# Patient Record
Sex: Female | Born: 1960 | Race: White | Hispanic: No | Smoking: Never smoker
Health system: Southern US, Community
[De-identification: ages and names within clinical notes are randomized; demographics above are authoritative.]

## PROBLEM LIST (undated history)

## (undated) DIAGNOSIS — J45909 Unspecified asthma, uncomplicated: Secondary | ICD-10-CM

## (undated) DIAGNOSIS — F329 Major depressive disorder, single episode, unspecified: Secondary | ICD-10-CM

## (undated) DIAGNOSIS — F32A Depression, unspecified: Secondary | ICD-10-CM

## (undated) HISTORY — PX: TONSILLECTOMY: SUR1361

## (undated) HISTORY — PX: WISDOM TOOTH EXTRACTION: SHX21

## (undated) HISTORY — PX: FINGER SURGERY: SHX640

---

## 2010-01-05 ENCOUNTER — Encounter: Payer: Self-pay | Admitting: Sports Medicine

## 2010-01-05 ENCOUNTER — Ambulatory Visit: Payer: Self-pay | Admitting: Sports Medicine

## 2010-01-05 DIAGNOSIS — M2170 Unequal limb length (acquired), unspecified site: Secondary | ICD-10-CM

## 2010-01-05 DIAGNOSIS — M79609 Pain in unspecified limb: Secondary | ICD-10-CM

## 2010-01-05 DIAGNOSIS — M84376A Stress fracture, unspecified foot, initial encounter for fracture: Secondary | ICD-10-CM | POA: Insufficient documentation

## 2010-02-07 ENCOUNTER — Emergency Department (HOSPITAL_COMMUNITY)
Admission: EM | Admit: 2010-02-07 | Discharge: 2010-02-08 | Payer: Self-pay | Source: Home / Self Care | Admitting: Emergency Medicine

## 2010-02-12 ENCOUNTER — Ambulatory Visit: Payer: Self-pay | Admitting: Sports Medicine

## 2010-03-31 NOTE — Assessment & Plan Note (Signed)
Summary: foot pain   Vital Signs:  Patient profile:   49 year old female Height:      70 inches Weight:      125 pounds BMI:     18.00 Pulse rate:   61 / minute BP sitting:   100 / 69  (left arm)  Vitals Entered By: Rochele Pages RN (January 05, 2010 11:48 AM) CC: rt foot pain, dorsal @ 2nd and 3rd metatarsals, wants orthotics    CC:  rt foot pain, dorsal @ 2nd and 3rd metatarsals, and wants orthotics .  History of Present Illness: Patient presents to clinic for evaluation of rt dorsal foot pain at 2nd and 3rd metatarsals due to stress fracture.  Patient experienced stress fracture in this area while running in minimalist shoes 08/2009.  She saw a doctor who put her in a boot for 3 months.  No running since she experienced the stress fracture. No pain at rest, but with prolonged walking pain worsens and causes limping on right foot.  Patient intrested in getting orthotics today.  She reports switching to the minimilist shoe because of problems with shin splints while training for a 1/2 marathon.  She was training up to 50 miles per week.  She is still experiencing some discomfort over her 2nd and 3rd metarsals with walking but has not returned running.  No other history of running related injuries and has been running recreational for  ~67yrs and has become more serious over the last 3 years.   Allergies (verified): 1)  ! Asa  Physical Exam  General:  Well-developed,well-nourished,in no acute distress; alert,appropriate and cooperative throughout examination Msk:  Foot Exam: Right Foot slight TTP over distal aspect of 2nd & 3rd metarsal.  No swelling. Negative Drawer Sign B.  Normal ROM B.   Left foot has signs of __TMT____ bossing with associated moderate collapse of the longitudinal arch and transverse arches.  Right foot has minimal to mild arch collapse.   R Short leg with 1.2cm difference compensated with mild levoscoliosis.    LE Strength 5+/5 diffusly with only slight asymetry  of hip aBductors with L slightly weaker than R  US showed 90% healed stress fx over 2nd metarsal.  No signs of stress fx on the 3rd metarsal at this time.   Gait Analysis:  Neutral running form with mild right foot in-toeing that is corrected with a sports orthotic with minimal additional heel pad to correct R short leg Extremities:  No clubbing, cyanosis, edema, echymosis, or deformity noted.   Impression & Recommendations:  Problem # 1:  FOOT PAIN, RIGHT (ICD-729.5)  This is at a mild level now no real swelling noted  Orders: [place] LIMITED ([id number])  Problem # 2:  STRESS FRACTURE OF THE METATARSALS (ICD-733.94)  scan does show clear cortical changes suggestinve of MT stress FX 2nd MT on RT this has hard callus and 90% closed on trans scan  sports insole with blue foam padding 1/2 cm added to RT  arch strap  begin easy runing program  reck 1 month  Orders: [place] LIMITED ([id number])  Problem # 3:  UNEQUAL LEG LENGTH (ICD-736.81) requires minor adjustment  may be able to get by with just sports insole if not enough support will do custom orthotics on RTC  Complete Medication List: 1)  Nuvaring 0.12-0.015 Mg/24hr Ring (Etonogestrel-ethinyl estradiol)  Patient Instructions: 1)  Thanks for coming intoday. 2)  Your stress fracture is 90% healed.  It is okay to start back  to running but slowly.  Limit yourself to 20 mins for 2 weeks then for the next 2 weeks and so forth from there.   Limit your activity if pain returns.   3)  You need to supplement Vit D and Calcium. 4)  Calcium 1500mg  daily 5)  Vitamin D 800 International Units (IU) daily 6)  Follow up in 4 weeks   Orders Added: 1)  New Patient Level III [99203] 2)  [place] LIMITED [[id number]]

## 2010-06-22 ENCOUNTER — Encounter: Payer: Self-pay | Admitting: *Deleted

## 2013-10-17 ENCOUNTER — Emergency Department (HOSPITAL_COMMUNITY)
Admission: EM | Admit: 2013-10-17 | Discharge: 2013-10-17 | Disposition: A | Discharge status: Home / Self Care | Payer: BC Managed Care – PPO | Attending: Emergency Medicine | Admitting: Emergency Medicine

## 2013-10-17 ENCOUNTER — Emergency Department (HOSPITAL_COMMUNITY): Payer: BC Managed Care – PPO

## 2013-10-17 ENCOUNTER — Encounter (HOSPITAL_COMMUNITY): Payer: Self-pay | Admitting: Emergency Medicine

## 2013-10-17 DIAGNOSIS — S61209A Unspecified open wound of unspecified finger without damage to nail, initial encounter: Secondary | ICD-10-CM | POA: Diagnosis present

## 2013-10-17 DIAGNOSIS — S68129A Partial traumatic metacarpophalangeal amputation of unspecified finger, initial encounter: Secondary | ICD-10-CM

## 2013-10-17 DIAGNOSIS — F411 Generalized anxiety disorder: Secondary | ICD-10-CM | POA: Insufficient documentation

## 2013-10-17 DIAGNOSIS — Y9389 Activity, other specified: Secondary | ICD-10-CM | POA: Insufficient documentation

## 2013-10-17 DIAGNOSIS — Z23 Encounter for immunization: Secondary | ICD-10-CM | POA: Diagnosis not present

## 2013-10-17 DIAGNOSIS — W292XXA Contact with other powered household machinery, initial encounter: Secondary | ICD-10-CM | POA: Diagnosis not present

## 2013-10-17 DIAGNOSIS — Y9289 Other specified places as the place of occurrence of the external cause: Secondary | ICD-10-CM | POA: Diagnosis not present

## 2013-10-17 DIAGNOSIS — S68119A Complete traumatic metacarpophalangeal amputation of unspecified finger, initial encounter: Secondary | ICD-10-CM

## 2013-10-17 DIAGNOSIS — IMO0002 Reserved for concepts with insufficient information to code with codable children: Secondary | ICD-10-CM | POA: Insufficient documentation

## 2013-10-17 MED ORDER — MORPHINE SULFATE 4 MG/ML IJ SOLN: 4.0000 mg | Freq: Once | INTRAMUSCULAR | Status: DC

## 2013-10-17 MED ORDER — TETANUS-DIPHTH-ACELL PERTUSSIS 5-2.5-18.5 LF-MCG/0.5 IM SUSP
0.5000 mL | Freq: Once | INTRAMUSCULAR | Status: AC
Start: 2013-10-17 — End: 2013-10-17
  Administered 2013-10-17: 0.5 mL via INTRAMUSCULAR
  Filled 2013-10-17: qty 0.5

## 2013-10-17 MED ORDER — CEFAZOLIN SODIUM 1-5 GM-% IV SOLN
1.0000 g | Freq: Once | INTRAVENOUS | Status: AC
  Administered 2013-10-17: 1 g via INTRAVENOUS
  Filled 2013-10-17: qty 50

## 2013-10-17 NOTE — Discharge Instructions (Signed)
[facility]: [address] [place] [address] [phone] DO NOT EAT OR DRINK ANYTHING.
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
## 2013-10-20 NOTE — ED Provider Notes (Signed)
Medical screening examination/treatment/procedure(s) were performed by non-physician practitioner and as supervising physician I was immediately available for consultation/collaboration.   EKG Interpretation None        Eloise Picone, DO 10/20/13 1451 

## 2017-10-16 ENCOUNTER — Emergency Department (HOSPITAL_COMMUNITY)
Admission: EM | Admit: 2017-10-16 | Discharge: 2017-10-16 | Disposition: A | Discharge status: Home / Self Care | Payer: BC Managed Care – PPO | Attending: Emergency Medicine | Admitting: Emergency Medicine

## 2017-10-16 ENCOUNTER — Emergency Department (HOSPITAL_COMMUNITY): Payer: BC Managed Care – PPO

## 2017-10-16 ENCOUNTER — Encounter (HOSPITAL_COMMUNITY): Payer: Self-pay | Admitting: *Deleted

## 2017-10-16 ENCOUNTER — Other Ambulatory Visit: Payer: Self-pay

## 2017-10-16 DIAGNOSIS — Y998 Other external cause status: Secondary | ICD-10-CM | POA: Diagnosis not present

## 2017-10-16 DIAGNOSIS — R911 Solitary pulmonary nodule: Secondary | ICD-10-CM

## 2017-10-16 DIAGNOSIS — S50311A Abrasion of right elbow, initial encounter: Secondary | ICD-10-CM | POA: Diagnosis not present

## 2017-10-16 DIAGNOSIS — S50312A Abrasion of left elbow, initial encounter: Secondary | ICD-10-CM | POA: Diagnosis not present

## 2017-10-16 DIAGNOSIS — S40212A Abrasion of left shoulder, initial encounter: Secondary | ICD-10-CM | POA: Insufficient documentation

## 2017-10-16 DIAGNOSIS — Y9355 Activity, bike riding: Secondary | ICD-10-CM | POA: Insufficient documentation

## 2017-10-16 DIAGNOSIS — Y929 Unspecified place or not applicable: Secondary | ICD-10-CM | POA: Diagnosis not present

## 2017-10-16 DIAGNOSIS — S0990XA Unspecified injury of head, initial encounter: Secondary | ICD-10-CM | POA: Diagnosis present

## 2017-10-16 DIAGNOSIS — Z79899 Other long term (current) drug therapy: Secondary | ICD-10-CM | POA: Insufficient documentation

## 2017-10-16 DIAGNOSIS — S060X9A Concussion with loss of consciousness of unspecified duration, initial encounter: Secondary | ICD-10-CM | POA: Insufficient documentation

## 2017-10-16 DIAGNOSIS — J45909 Unspecified asthma, uncomplicated: Secondary | ICD-10-CM | POA: Insufficient documentation

## 2017-10-16 DIAGNOSIS — S40211A Abrasion of right shoulder, initial encounter: Secondary | ICD-10-CM | POA: Diagnosis not present

## 2017-10-16 DIAGNOSIS — T07XXXA Unspecified multiple injuries, initial encounter: Secondary | ICD-10-CM

## 2017-10-16 HISTORY — DX: Depression, unspecified: F32.A

## 2017-10-16 HISTORY — DX: Major depressive disorder, single episode, unspecified: F32.9

## 2017-10-16 HISTORY — DX: Unspecified asthma, uncomplicated: J45.909

## 2017-10-16 NOTE — Discharge Instructions (Addendum)
Please stay with another person for the next 24 hours. Recheck with your doctor this week No driving or operating machinery until seen for recheck Shower wounds regularly Use tylenol and ibuprofen for pain
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
## 2018-12-21 ENCOUNTER — Other Ambulatory Visit: Payer: Self-pay

## 2018-12-21 DIAGNOSIS — Z20822 Contact with and (suspected) exposure to covid-19: Secondary | ICD-10-CM

## 2018-12-23 LAB — NOVEL CORONAVIRUS, NAA: SARS-CoV-2, NAA: DETECTED — AB
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
## 2021-07-06 ENCOUNTER — Other Ambulatory Visit (HOSPITAL_COMMUNITY): Payer: Self-pay | Admitting: Family Medicine

## 2021-07-06 DIAGNOSIS — E785 Hyperlipidemia, unspecified: Secondary | ICD-10-CM

## 2021-08-18 ENCOUNTER — Ambulatory Visit (HOSPITAL_COMMUNITY): Payer: Self-pay

## 2021-08-20 ENCOUNTER — Ambulatory Visit (HOSPITAL_COMMUNITY)
Admission: RE | Admit: 2021-08-20 | Discharge: 2021-08-20 | Disposition: A | Discharge status: Home / Self Care | Payer: BC Managed Care – PPO | Source: Ambulatory Visit | Attending: Family Medicine | Admitting: Family Medicine

## 2021-08-20 DIAGNOSIS — E785 Hyperlipidemia, unspecified: Secondary | ICD-10-CM | POA: Insufficient documentation

## 2021-09-28 ENCOUNTER — Other Ambulatory Visit: Payer: Self-pay | Admitting: Pulmonary Disease

## 2021-09-28 DIAGNOSIS — Z1389 Encounter for screening for other disorder: Secondary | ICD-10-CM

## 2022-01-19 ENCOUNTER — Other Ambulatory Visit: Payer: Self-pay | Admitting: Family Medicine

## 2022-01-19 DIAGNOSIS — R10819 Abdominal tenderness, unspecified site: Secondary | ICD-10-CM

## 2022-01-28 ENCOUNTER — Other Ambulatory Visit: Payer: BC Managed Care – PPO

## 2022-02-02 ENCOUNTER — Ambulatory Visit
Admission: RE | Admit: 2022-02-02 | Discharge: 2022-02-02 | Disposition: A | Discharge status: Home / Self Care | Payer: BC Managed Care – PPO | Source: Ambulatory Visit | Attending: Family Medicine | Admitting: Family Medicine

## 2022-02-02 ENCOUNTER — Other Ambulatory Visit: Payer: BC Managed Care – PPO

## 2022-02-02 DIAGNOSIS — R10819 Abdominal tenderness, unspecified site: Secondary | ICD-10-CM

## 2022-02-26 ENCOUNTER — Ambulatory Visit
Admission: RE | Admit: 2022-02-26 | Discharge: 2022-02-26 | Disposition: A | Discharge status: Home / Self Care | Payer: No Typology Code available for payment source | Source: Ambulatory Visit | Attending: Pulmonary Disease | Admitting: Pulmonary Disease

## 2022-02-26 DIAGNOSIS — Z1389 Encounter for screening for other disorder: Secondary | ICD-10-CM

## 2022-03-23 ENCOUNTER — Other Ambulatory Visit: Payer: BC Managed Care – PPO

## 2022-04-15 ENCOUNTER — Ambulatory Visit: Payer: BC Managed Care – PPO | Attending: Cardiovascular Disease | Admitting: Cardiovascular Disease

## 2022-04-15 ENCOUNTER — Encounter: Payer: Self-pay | Admitting: Cardiovascular Disease

## 2022-04-15 VITALS — BP 118/70 | HR 63 | Ht 69.0 in | Wt 139.4 lb

## 2022-04-15 DIAGNOSIS — I712 Thoracic aortic aneurysm, without rupture, unspecified: Secondary | ICD-10-CM | POA: Diagnosis not present

## 2022-04-15 DIAGNOSIS — R42 Dizziness and giddiness: Secondary | ICD-10-CM

## 2022-04-15 NOTE — Patient Instructions (Signed)
Medication Instructions:  No changes *If you need a refill on your cardiac medications before your next appointment, please call your pharmacy*   Lab Work: none   Testing/Procedures: Your physician has requested that you have an echocardiogram. Echocardiography is a painless test that uses sound waves to create images of your heart. It provides your doctor with information about the size and shape of your heart and how well your heart's chambers and valves are working. This procedure takes approximately one hour. There are no restrictions for this procedure. Please do NOT wear cologne, perfume, aftershave, or lotions (deodorant is allowed). Please arrive 15 minutes prior to your appointment time.   Follow-Up: At [facility] you and your health needs are our priority.  As part of our continuing mission to provide you with exceptional heart care, we have created designated Provider Care Teams.  These Care Teams include your primary Cardiologist (physician) and Advanced Practice Providers (APPs -  Physician Assistants and Nurse Practitioners) who all work together to provide you with the care you need, when you need it.   Your next appointment:   12 month(s)  Provider:   Lauree Chandler, MD     Other Instructions Will arrange next CT scan aorta at next year's visit.

## 2022-04-15 NOTE — Progress Notes (Signed)
Chief Complaint  Patient presents with   New Patient (Initial Visit)    Abnormal CT, cardiomegaly   History of Present Illness:62 yo female with history of asthma and mild dilation of the ascending aorta who is here today as a new consult for the evaluation of dizziness and abnormal CT. CT coronary calcium score of 0 in June 2023. Ascending aorta noted to be mildly dilated by the non-contrast CT in June 2023. Chest CTA in January 2024 with 4.0 cm ascending aorta, suggestion of enlarged heart. She tells me today that she has had some dizziness. This is feeling of being lightheaded. No near syncope or syncope. No chest pain or dyspnea. She is very active.   Primary Care Physician: London Pepper, MD   Past Medical History:  Diagnosis Date   Asthma    Depression     Past Surgical History:  Procedure Laterality Date   FINGER SURGERY     TONSILLECTOMY     WISDOM TOOTH EXTRACTION      Current Outpatient Medications  Medication Sig Dispense Refill   calcium-vitamin D (OSCAL WITH D) 500-200 MG-UNIT per tablet Take 1 tablet by mouth daily with breakfast.     Digestive Enzyme CAPS daily.     sertraline (ZOLOFT) 50 MG tablet Take 50 mg by mouth daily. Per patient taking 1/2 tablet (8m) daily     valACYclovir (VALTREX) 500 MG tablet Take 500 mg by mouth 2 (two) times daily as needed (for fever blisters).      No current facility-administered medications for this visit.    Allergies  Allergen Reactions   Aspirin    Codeine     Pt doesn't remember reaction.   Shellfish Allergy Hives    Social History   Socioeconomic History   Marital status: Divorced    Spouse name: Not on file   Number of children: 2   Years of education: Not on file   Highest education level: Not on file  Occupational History   Occupation: Works with autism  Tobacco Use   Smoking status: Never   Smokeless tobacco: Never  Vaping Use   Vaping Use: Never used  Substance and Sexual Activity   Alcohol  use: Yes    Comment: occasionally   Drug use: No   Sexual activity: Not on file  Other Topics Concern   Not on file  Social History Narrative   Not on file   Social Determinants of Health   Financial Resource Strain: Not on file  Food Insecurity: Not on file  Transportation Needs: Not on file  Physical Activity: Not on file  Stress: Not on file  Social Connections: Not on file  Intimate Partner Violence: Not on file    Family History  Problem Relation Age of Onset   Pulmonary fibrosis Father     Review of Systems:  As stated in the HPI and otherwise negative.   BP 118/70   Pulse 63   Ht 5' 9"$  (1.753 m)   Wt 63.2 kg   LMP 09/12/2013   SpO2 99%   BMI 20.59 kg/m   Physical Examination: General: Well developed, well nourished, NAD  HEENT: OP clear, mucus membranes moist  SKIN: warm, dry. No rashes. Neuro: No focal deficits  Musculoskeletal: Muscle strength 5/5 all ext  Psychiatric: Mood and affect normal  Neck: No JVD, no carotid bruits, no thyromegaly, no lymphadenopathy.  Lungs:Clear bilaterally, no wheezes, rhonci, crackles Cardiovascular: Regular rate and rhythm. No murmurs, gallops or rubs.  Abdomen:Soft. Bowel sounds present. Non-tender.  Extremities: No lower extremity edema. Pulses are 2 + in the bilateral DP/PT.  EKG:  EKG is ordered today. The ekg ordered today demonstrates NSR, Non-specific ST abnormality  Recent Labs: No results found for requested labs within last 365 days.   Lipid Panel No results found for: "CHOL", "TRIG", "HDL", "CHOLHDL", "VLDL", "LDLCALC", "LDLDIRECT"   Wt Readings from Last 3 Encounters:  04/15/22 63.2 kg  10/16/17 59 kg  01/05/10 56.7 kg    Assessment and Plan:   1. Dizziness: Her dizziness may be related to inner ear issues. Occurs at random times. No near syncope. BP soft at times at home but ok today. Possible cardiomegaly on chest CT. Will arrange an echo to assess LVEF and exclude structural heart disease.   2.  Thoracic aortic aneurysm: 4.0 cm by CT chest in January 2024. Repeat one year  Labs/ tests ordered today include:   Orders Placed This Encounter  Procedures   EKG 12-Lead   ECHOCARDIOGRAM COMPLETE   Disposition:   F/U with me in one year   Signed, Lauree Chandler, MD, [facility] 04/15/2022 9:24 AM    [facility] [address], [place], [place]  [address] Phone: [phone]; Fax: [phone]

## 2022-05-11 ENCOUNTER — Ambulatory Visit (HOSPITAL_COMMUNITY): Payer: BC Managed Care – PPO | Attending: Cardiovascular Disease

## 2022-05-11 DIAGNOSIS — R42 Dizziness and giddiness: Secondary | ICD-10-CM | POA: Diagnosis not present

## 2022-05-11 LAB — ECHOCARDIOGRAM COMPLETE
Area-P 1/2: 3.85 cm2
S' Lateral: 3.3 cm

## 2023-02-24 ENCOUNTER — Other Ambulatory Visit: Payer: Self-pay | Admitting: Family Medicine

## 2023-02-24 DIAGNOSIS — I77810 Thoracic aortic ectasia: Secondary | ICD-10-CM

## 2023-03-01 ENCOUNTER — Inpatient Hospital Stay
Admission: RE | Admit: 2023-03-01 | Discharge: 2023-03-01 | Discharge status: Home / Self Care | Payer: BC Managed Care – PPO | Source: Ambulatory Visit | Attending: Family Medicine | Admitting: Family Medicine

## 2023-03-01 DIAGNOSIS — I77810 Thoracic aortic ectasia: Secondary | ICD-10-CM

## 2023-03-01 MED ORDER — IOPAMIDOL (ISOVUE-370) INJECTION 76%
75.0000 mL | Freq: Once | INTRAVENOUS | Status: AC | PRN
  Administered 2023-03-01: 75 mL via INTRAVENOUS

## 2023-03-29 NOTE — Progress Notes (Signed)
 Cardiology Office Note:  .   Date:  04/11/2023  ID:  Dodson Freestone, DOB 12/23/1960, MRN [id number] PCP: Ronna Coho, MD  [facility] Providers Cardiologist:  Antoinette Batman, MD    History of Present Illness: .   Jamie Nash is a 63 y.o. female  with history of asthma and mild dilation of the ascending aorta  She saw Dr. Abel Hoe 04/2022 for dizziness and abnormal CT. CT coronary calcium score of 0 in June 2023. Ascending aorta noted to be mildly dilated by the non-contrast CT in June 2023. Chest CTA in January 2024 with 4.0 cm ascending aorta, suggestion of enlarged heart. Echo 04/2022 normal LVEF G2DD.  CTA 01/2023 4.0 ascending aorta.  Patient comes in for yearly f/u. Denies any chest pain, dyspnea, palpitations, dizziness, edema. Walks 20 miles weekly. Questions answered about activity restriction with dilated aorta.  ROS:    Studies Reviewed: Aaron Aas    EKG Interpretation Date/Time:  Monday April 11 2023 09:05:21 EST Ventricular Rate:  58 PR Interval:  130 QRS Duration:  90 QT Interval:  424 QTC Calculation: 416 R Axis:   -12  Text Interpretation: Sinus bradycardia Nonspecific ST abnormality No previous ECGs available Confirmed by Theotis Flake [phone]) on 04/11/2023 9:12:42 AM    Prior CV Studies:   Echo 04/2022   IMPRESSION: Relatively unchanged size and configuration of the ascending aorta, estimated 4.0 cm on the current CT. Recommend annual imaging followup by CTA or MRA. This recommendation follows 2010 ACCF/AHA/AATS/ACR/ASA/SCA/SCAI/SIR/STS/SVM Guidelines for the Diagnosis and Management of Patients with Thoracic Aortic Disease. Circulation. 2010; 121: M578-I696. Aortic aneurysm NOS (ICD10-I71.9)   Signed,   Marciano Settles. Rexine Cater, RPVI   [facility]   [facility]     Electronically Signed   By: Myrlene Asper D.O.   On: 03/03/2023 13:26   CT angio chest 01/2023   IMPRESSION: Relatively  unchanged size and configuration of the ascending aorta, estimated 4.0 cm on the current CT. Recommend annual imaging followup by CTA or MRA. This recommendation follows 2010 ACCF/AHA/AATS/ACR/ASA/SCA/SCAI/SIR/STS/SVM Guidelines for the Diagnosis and Management of Patients with Thoracic Aortic Disease. Circulation. 2010; 121: E952-W413. Aortic aneurysm NOS (ICD10-I71.9)   Signed,   Marciano Settles. Rexine Cater, RPVI   [facility]   [facility]     Electronically Signed   By: Myrlene Asper D.O.   On: 03/03/2023 13:26  Risk Assessment/Calculations:             Physical Exam:   VS:  BP 110/68 (BP Location: Right Arm, Patient Position: Sitting, Cuff Size: Normal)   Pulse 65   Resp 16   Ht 5\' 9"  (1.753 m)   Wt 140 lb 3.2 oz (63.6 kg)   LMP 09/12/2013   SpO2 98%   BMI 20.70 kg/m    Wt Readings from Last 3 Encounters:  04/11/23 140 lb 3.2 oz (63.6 kg)  04/15/22 139 lb 6.4 oz (63.2 kg)  10/16/17 130 lb (59 kg)    GEN: Thin, in no acute distress NECK: No JVD; No carotid bruits CARDIAC:  RRR, no murmurs, rubs, gallops RESPIRATORY:  Clear to auscultation without rales, wheezing or rhonchi  ABDOMEN: Soft, non-tender, non-distended EXTREMITIES:  No edema; No deformity   ASSESSMENT AND PLAN: .    Dilated Ascending aorta 4.0  01/2023 remains stable and unchanged   One of your tests has shown an aneurysm of your . The word "aneurysm" refers to a bulge in an  artery (blood vessel). Most people think of them in the context of an emergency, but yours was found incidentally. At this point there is nothing you need to do from a procedure standpoint, but there are some important things to keep in mind for day-to-day life.  Mainstays of therapy for aneurysms include very good blood pressure control, healthy lifestyle, and avoiding tobacco products and street drugs. Research has raised concern that antibiotics in the fluoroquinolone class could  be associated with increased risk of having an aneurysm develop or tear. This includes medicines that end in "floxacin," like Cipro or Levaquin. Make sure to discuss this information with other healthcare providers if you require antibiotics.  Since aneurysms can run in families, you should discuss your diagnosis with first degree relatives as they may need to be screened for this. Regular mild-moderate physical exercise is important, but avoid heavy lifting/weight lifting over 30lbs, chopping wood, shoveling snow or digging heavy earth with a shovel. It is best to avoid activities that cause grunting or straining (medically referred to as a "Valsalva maneuver"). This happens when a person bears down against a closed throat to increase the strength of arm or abdominal muscles. There's often a tendency to do this when lifting heavy weights, doing sit-ups, push-ups or chin-ups, etc., but it may be harmful.  This is a finding I would expect to be monitored periodically by your cardiology team. Most unruptured thoracic aortic aneurysms cause no symptoms, so they are often found during exams for other conditions. Contact a health care provider if you develop any discomfort in your upper back, neck, abdomen, trouble swallowing, cough or hoarseness, or unexplained weight loss. Get help right away if you develop severe pain in your upper back or abdomen that may move into your chest and arms, or any other concerning symptoms such as shortness of breath or fever.    Echo 04/3022 normal LVEF grade 2 DD-no edema  Dizziness felt due to inner ear issues.  Calcium score 0        Dispo: f/u in 1 yr with CTA  Signed, Theotis Flake, PA-C

## 2023-04-11 ENCOUNTER — Ambulatory Visit: Payer: 59 | Attending: Physician Assistant | Admitting: Physician Assistant

## 2023-04-11 ENCOUNTER — Encounter: Payer: Self-pay | Admitting: Physician Assistant

## 2023-04-11 VITALS — BP 110/68 | HR 65 | Resp 16 | Ht 69.0 in | Wt 140.2 lb

## 2023-04-11 DIAGNOSIS — I5189 Other ill-defined heart diseases: Secondary | ICD-10-CM

## 2023-04-11 DIAGNOSIS — I77819 Aortic ectasia, unspecified site: Secondary | ICD-10-CM

## 2023-04-11 NOTE — Patient Instructions (Addendum)
 Medication Instructions:  Your physician recommends that you continue on your current medications as directed. Please refer to the Current Medication list given to you today.  *If you need a refill on your cardiac medications before your next appointment, please call your pharmacy*   Lab Work: None ordered  If you have labs (blood work) drawn today and your tests are completely normal, you will receive your results only by: MyChart Message (if you have MyChart) OR A paper copy in the mail If you have any lab test that is abnormal or we need to change your treatment, we will call you to review the results.   Testing/Procedures: None ordered   Follow-Up: At [facility], you and your health needs are our priority.  As part of our continuing mission to provide you with exceptional heart care, we have created designated Provider Care Teams.  These Care Teams include your primary Cardiologist (physician) and Advanced Practice Providers (APPs -  Physician Assistants and Nurse Practitioners) who all work together to provide you with the care you need, when you need it.  We recommend signing up for the patient portal called "MyChart".  Sign up information is provided on this After Visit Summary.  MyChart is used to connect with patients for Virtual Visits (Telemedicine).  Patients are able to view lab/test results, encounter notes, upcoming appointments, etc.  Non-urgent messages can be sent to your provider as well.   To learn more about what you can do with MyChart, go to [email or web address].    Your next appointment:   12 month(s)  Provider:   Antoinette Batman, MD     Other Instructions  One of your tests has shown an aneurysm of your aorta. The word "aneurysm" refers to a bulge in an artery (blood vessel). Most people think of them in the context of an emergency, but yours was found incidentally. At this point there is nothing you need to do from a procedure standpoint,  but there are some important things to keep in mind for day-to-day life.  Mainstays of therapy for aneurysms include very good blood pressure control, healthy lifestyle, and avoiding tobacco products and street drugs. Research has raised concern that antibiotics in the fluoroquinolone class could be associated with increased risk of having an aneurysm develop or tear. This includes medicines that end in "floxacin," like Cipro or Levaquin. Make sure to discuss this information with other healthcare providers if you require antibiotics.  Since aneurysms can run in families, you should discuss your diagnosis with first degree relatives as they may need to be screened for this. Regular mild-moderate physical exercise is important, but avoid heavy lifting/weight lifting over 30lbs, chopping wood, shoveling snow or digging heavy earth with a shovel. It is best to avoid activities that cause grunting or straining (medically referred to as a "Valsalva maneuver"). This happens when a person bears down against a closed throat to increase the strength of arm or abdominal muscles. There's often a tendency to do this when lifting heavy weights, doing sit-ups, push-ups or chin-ups, etc., but it may be harmful.  This is a finding I would expect to be monitored periodically by your cardiology team. Most unruptured thoracic aortic aneurysms cause no symptoms, so they are often found during exams for other conditions. Contact a health care provider if you develop any discomfort in your upper back, neck, abdomen, trouble swallowing, cough or hoarseness, or unexplained weight loss. Get help right away if you develop severe pain in your upper  back or abdomen that may move into your chest and arms, or any other concerning symptoms such as shortness of breath or fever.      1st Floor: - Lobby - Registration  - Pharmacy  - Lab - Cafe  2nd Floor: - PV Lab - Diagnostic Testing (echo, CT, nuclear med)  3rd Floor: -  Vacant  4th Floor: - TCTS (cardiothoracic surgery) - AFib Clinic - Structural Heart Clinic - Vascular Surgery  - Vascular Ultrasound  5th Floor: - HeartCare Cardiology (general and EP) - Clinical Pharmacy for coumadin, hypertension, lipid, weight-loss medications, and med management appointments    Valet parking services will be available as well.

## 2023-09-23 ENCOUNTER — Other Ambulatory Visit: Payer: Self-pay | Admitting: Medical Genetics

## 2023-10-20 ENCOUNTER — Encounter: Payer: Self-pay | Admitting: Cardiovascular Disease

## 2023-11-09 ENCOUNTER — Other Ambulatory Visit

## 2023-11-09 DIAGNOSIS — Z006 Encounter for examination for normal comparison and control in clinical research program: Secondary | ICD-10-CM

## 2023-11-18 LAB — GENECONNECT MOLECULAR SCREEN: Genetic Analysis Overall Interpretation: NEGATIVE

## 2023-12-27 ENCOUNTER — Encounter: Payer: Self-pay | Admitting: Cardiovascular Disease

## 2023-12-27 NOTE — Telephone Encounter (Signed)
 Patient is returning call.

## 2023-12-27 NOTE — Telephone Encounter (Signed)
 I spoke with patient and she will be here for appointment on 10/30

## 2023-12-27 NOTE — Telephone Encounter (Signed)
 Call placed to patient. Left message to call office.  My chart message sent to patient

## 2023-12-28 NOTE — Progress Notes (Addendum)
 Cardiology Office Note   Date:  12/29/2023  ID:  Jamie Nash, DOB 1960-03-31, MRN [id number] PCP: Jamie Righter, MD  [facility] Providers Cardiologist:  Lonni Cash, MD   History of Present Illness Jamie Nash is a 63 y.o. female with a past medical history of asthma and mild dilation of ascending aorta here for follow-up appointment.  Dr. Cash saw the patient back in February 2024 for dizziness and abnormal CT.  CT coronary calcium score of 0 in June 2023.  Ascending aorta noted to be mildly dilated by the noncontrast CT in June 2023.  Chest CTA in January 2024 with 4.0 cm ascending aorta, enlargement of the heart.  CTA 01/2023 with 4.0 ascending aorta.  Was last seen February of this year and denied any chest pain or dyspnea.  No palpitations, dizziness, edema.  Walks 20 miles weekly.  Questions answered about activity restriction with dilated aorta.  She recently called and stated that she was anxious at times and felt like she was experiencing more discomfort in her chest area.  Felt like heartburn a lot of the time to be taking some antacids.  Also she stated it seemed like she had some weakness in the left arm especially around the inner side of her upper arm.  Also stated it was easier for her to become short of breath.  Colonoscopy is scheduled for November 10  Today, she presents with a hx of thoracic aneurysm  with chest discomfort and shortness of breath.  Chest discomfort occurs more frequently with meals and is not related to physical activity. Shortness of breath accompanies the discomfort, especially during exertion like stair climbing. She experiences a 'low throb' or heaviness in the inner part of her arm, without radiation to her fingers or associated tightness.  Her thoracic aneurysm measured 40 millimeters on a CT scan in December of the previous year and has been monitored for two years. She is not on any medications for blood pressure.  She  maintains an active lifestyle, walking up to five miles a day and climbing stairs, but is concerned about continuing these activities due to her medical conditions.  No edema, orthopnea, PND. Reports no palpitations.   Discussed the use of AI scribe software for clinical note transcription with the patient, who gave verbal consent to proceed.   ROS: Pertinent ROS in HPI  Studies Reviewed     Echo 04/2022   IMPRESSION: Relatively unchanged size and configuration of the ascending aorta, estimated 4.0 cm on the current CT. Recommend annual imaging followup by CTA or MRA. This recommendation follows 2010 ACCF/AHA/AATS/ACR/ASA/SCA/SCAI/SIR/STS/SVM Guidelines for the Diagnosis and Management of Patients with Thoracic Aortic Disease. Circulation. 2010; 121: Z733-z630. Aortic aneurysm NOS (ICD10-I71.9)   Signed,   Ami RAMAN. Alona ROSALEA GRAVER, RPVI   [facility]   [facility] Radiology     Electronically Signed   By: Ami Alona D.O.   On: 03/03/2023 13:26     CT angio chest 01/2023   IMPRESSION: Relatively unchanged size and configuration of the ascending aorta, estimated 4.0 cm on the current CT. Recommend annual imaging followup by CTA or MRA. This recommendation follows 2010 ACCF/AHA/AATS/ACR/ASA/SCA/SCAI/SIR/STS/SVM Guidelines for the Diagnosis and Management of Patients with Thoracic Aortic Disease. Circulation. 2010; 121: Z733-z630. Aortic aneurysm NOS (ICD10-I71.9)   Signed,   Ami RAMAN. Alona ROSALEA GRAVER, RPVI   [facility]   [facility]iology     Electronically Signed   By: Ami Alona  D.O.   On: 03/03/2023 13:26      Physical Exam VS:  BP (!) 92/54   Pulse 60   Ht 5' 9 (1.753 m)   Wt 143 lb (64.9 kg)   LMP 09/12/2013   SpO2 99%   BMI 21.12 kg/m        Wt Readings from Last 3 Encounters:  12/29/23 143 lb (64.9 kg)  04/11/23 140 lb 3.2 oz (63.6 kg)  04/15/22 139 lb 6.4  oz (63.2 kg)    GEN: Well nourished, well developed in no acute distress NECK: No JVD; No carotid bruits CARDIAC: RRR, no murmurs, rubs, gallops RESPIRATORY:  Clear to auscultation without rales, wheezing or rhonchi  ABDOMEN: Soft, non-tender, non-distended EXTREMITIES:  No edema; No deformity   ASSESSMENT AND PLAN  Preop Clearance  Ms. Heeg's perioperative risk of a major cardiac event is 0.4% according to the Revised Cardiac Risk Index (RCRI).  Therefore, she is at low risk for perioperative complications.   Her functional capacity is good at 6.36 METs according to the Duke Activity Status Index (DASI). Recommendations: She will need a CTA prior to surgery clearance Precordial chest discomfort and exertional dyspnea Intermittent precordial chest discomfort and exertional dyspnea. Symptoms may suggest GERD, but not clearly related to activity. Zero coronary artery calcium score, but further evaluation needed due to potential atypical presentation in women. - Order CTA of the coronaries to evaluate for any significant blockage or stenosis.  Addendum:  CT of the chest showed that your ascending thoracic aortic aneurysm is stable (actually decreased) to 3.8 cm.  This could just be based on the image you are and where your aneurysm was measured.  It also looks like they were able to look at your coronaries as well.  You had a calcium score of 0 with normal coronary arteries.  I have no reservations when it comes to clearing you for your upcoming surgery. Can move forward with surgery without further CV testing.  Thoracic aortic aneurysm Thoracic aortic aneurysm 40 mm, stable over the past year. Annual surveillance due. No significant growth expected, hypertension absent. - Order CTA to assess current size of thoracic aortic aneurysm. - Advise avoiding heavy weightlifting to prevent aneurysm growth. - Advise avoiding fluoroquinolone antibiotics due to aneurysm.  Diastolic  dysfunction Diastolic dysfunction noted on previous echocardiogram, common with aging. May contribute to exertional dyspnea.  Hypotension Low blood pressure noted, potentially contributing to dizziness. Not on antihypertensive medications. Possible dehydration or low salt intake. - Advise increasing salt intake to help raise blood pressure. - Monitor for symptoms of dizziness and adjust activity as needed.   Dispo: She can follow-up in 6 months.   Signed, Orren LOISE Fabry, PA-C

## 2023-12-29 ENCOUNTER — Telehealth (HOSPITAL_BASED_OUTPATIENT_CLINIC_OR_DEPARTMENT_OTHER): Payer: Self-pay | Admitting: *Deleted

## 2023-12-29 ENCOUNTER — Ambulatory Visit: Attending: Physician Assistant | Admitting: Physician Assistant

## 2023-12-29 ENCOUNTER — Encounter: Payer: Self-pay | Admitting: Physician Assistant

## 2023-12-29 VITALS — BP 92/54 | HR 60 | Ht 69.0 in | Wt 143.0 lb

## 2023-12-29 DIAGNOSIS — Z01810 Encounter for preprocedural cardiovascular examination: Secondary | ICD-10-CM

## 2023-12-29 DIAGNOSIS — I712 Thoracic aortic aneurysm, without rupture, unspecified: Secondary | ICD-10-CM | POA: Diagnosis not present

## 2023-12-29 DIAGNOSIS — I5189 Other ill-defined heart diseases: Secondary | ICD-10-CM

## 2023-12-29 DIAGNOSIS — R072 Precordial pain: Secondary | ICD-10-CM

## 2023-12-29 DIAGNOSIS — R42 Dizziness and giddiness: Secondary | ICD-10-CM | POA: Diagnosis not present

## 2023-12-29 DIAGNOSIS — I77819 Aortic ectasia, unspecified site: Secondary | ICD-10-CM

## 2023-12-29 NOTE — Telephone Encounter (Signed)
   Pre-operative Risk Assessment    Patient Name: Jamie Nash  DOB: 05/29/1960 MRN: [id number]   Date of last office visit: 12/29/2023 Date of next office visit: None  Request for Surgical Clearance    Procedure:  Colonoscopy  Date of Surgery:  Clearance 01/09/24                                 Surgeon:  Dr. Estefana Keas Surgeon's Group or Practice Name:  [facility] Phone number:  [phone] Fax number:  [phone]   Type of Clearance Requested:   - Medical    Type of Anesthesia:  Propofol   Additional requests/questions:  This was sent to Orren Fabry, PA whom seen patient today.   Signed, Edsel Grayce Sanders   12/29/2023, 11:32 AM

## 2023-12-29 NOTE — Patient Instructions (Signed)
 Medication Instructions:  Your physician recommends that you continue on your current medications as directed. Please refer to the Current Medication list given to you today.` *If you need a refill on your cardiac medications before your next appointment, please call your pharmacy*  Lab Work: None ordered If you have labs (blood work) drawn today and your tests are completely normal, you will receive your results only by: MyChart Message (if you have MyChart) OR A paper copy in the mail If you have any lab test that is abnormal or we need to change your treatment, we will call you to review the results.  Testing/Procedures: Non-Cardiac CT Angiography (CTA), is a special type of CT scan that uses a computer to produce multi-dimensional views of major blood vessels throughout the body. In CT angiography, a contrast material is injected through an IV to help visualize the blood vessels   Follow-Up: At [facility], you and your health needs are our priority.  As part of our continuing mission to provide you with exceptional heart care, our providers are all part of one team.  This team includes your primary Cardiologist (physician) and Advanced Practice Providers or APPs (Physician Assistants and Nurse Practitioners) who all work together to provide you with the care you need, when you need it.  Your next appointment:   6 month(s)  Provider:   Lonni Cash, MD or ANY APP   We recommend signing up for the patient portal called MyChart.  Sign up information is provided on this After Visit Summary.  MyChart is used to connect with patients for Virtual Visits (Telemedicine).  Patients are able to view lab/test results, encounter notes, upcoming appointments, etc.  Non-urgent messages can be sent to your provider as well.   To learn more about what you can do with MyChart, go to [email or web address].   Other Instructions

## 2024-01-02 ENCOUNTER — Encounter: Payer: Self-pay | Admitting: Physician Assistant

## 2024-01-03 NOTE — Telephone Encounter (Signed)
 Orren, you saw patient on 10/30 and mention needed CTA of aorta prior to surgery. Did you also plan to get coronary CTA? If so, there is not an order for that. Patient has colonscopy scheduled for 11/10.   Thanks, Rosaline

## 2024-01-05 ENCOUNTER — Other Ambulatory Visit (HOSPITAL_BASED_OUTPATIENT_CLINIC_OR_DEPARTMENT_OTHER): Payer: Self-pay | Admitting: *Deleted

## 2024-01-05 ENCOUNTER — Ambulatory Visit (HOSPITAL_COMMUNITY)

## 2024-01-05 ENCOUNTER — Ambulatory Visit (HOSPITAL_COMMUNITY)
Admission: RE | Admit: 2024-01-05 | Discharge: 2024-01-05 | Disposition: A | Discharge status: Home / Self Care | Source: Ambulatory Visit | Attending: Physician Assistant | Admitting: Physician Assistant

## 2024-01-05 DIAGNOSIS — R0609 Other forms of dyspnea: Secondary | ICD-10-CM | POA: Insufficient documentation

## 2024-01-05 DIAGNOSIS — I5189 Other ill-defined heart diseases: Secondary | ICD-10-CM | POA: Diagnosis present

## 2024-01-05 DIAGNOSIS — I77819 Aortic ectasia, unspecified site: Secondary | ICD-10-CM | POA: Insufficient documentation

## 2024-01-05 DIAGNOSIS — R072 Precordial pain: Secondary | ICD-10-CM | POA: Insufficient documentation

## 2024-01-05 DIAGNOSIS — R42 Dizziness and giddiness: Secondary | ICD-10-CM | POA: Insufficient documentation

## 2024-01-05 DIAGNOSIS — I712 Thoracic aortic aneurysm, without rupture, unspecified: Secondary | ICD-10-CM | POA: Insufficient documentation

## 2024-01-05 DIAGNOSIS — Z01810 Encounter for preprocedural cardiovascular examination: Secondary | ICD-10-CM | POA: Diagnosis present

## 2024-01-05 MED ORDER — NITROGLYCERIN 0.4 MG SL SUBL
0.8000 mg | SUBLINGUAL_TABLET | Freq: Once | SUBLINGUAL | Status: AC
  Administered 2024-01-05: 0.8 mg via SUBLINGUAL

## 2024-01-05 MED ORDER — IOHEXOL 350 MG/ML SOLN
95.0000 mL | Freq: Once | INTRAVENOUS | Status: AC | PRN
  Administered 2024-01-05: 95 mL via INTRAVENOUS

## 2024-01-05 NOTE — Telephone Encounter (Signed)
 Patient is having both CT's today at noon  Patient aware of date, time, and location

## 2024-01-06 ENCOUNTER — Ambulatory Visit (HOSPITAL_COMMUNITY)

## 2024-01-06 NOTE — Telephone Encounter (Signed)
 I have reached out to Orren Fabry, [place] for her recommendations. She is not available in secure chat and is listed as OFF today and on CME next week. Will await her response as I am sure she will check her box.  KL

## 2024-01-06 NOTE — Telephone Encounter (Signed)
 Checking on status of clearance Please call [facility] if it is before 4. If after 4 ask for [facility] [phone]

## 2024-01-06 NOTE — Telephone Encounter (Signed)
 Orren,  You saw this patient on 12/29/2023. Ordered coronary CTA completed on 01/05/2024.   IMPRESSION: 1. Coronary calcium score of 0.   2. Normal coronary origin with right dominance.   3. Normal coronary arteries.   4. Ascending aorta 41 x 40 mm ascending - stable. No evidence of dissection.   Still waiting on extracardiac read when reviewed on 01/06/2024.   Per protocol we request that you comment on his cardiac risk to proceed with colonoscopy, since it has been less than 2 months since evaluated in the office. Please send your comment to P CV Pre-Op Pool.  Thank you, Lamarr Satterfield DNP, ANP, AACC.

## 2024-01-06 NOTE — Telephone Encounter (Signed)
   Name: Ishi Danser  DOB: Dec 28, 1960  MRN: [id number]   Primary Cardiologist: Lonni Cash, MD  Chart reviewed as part of pre-operative protocol coverage. Anjelique Makar was last seen on 12/29/2023 by Orren Fabry, PA-C.  Per Orren, Ms. Siegenthaler's perioperative risk of a major cardiac event is 0.4% according to the Revised Cardiac Risk Index (RCRI).  Therefore, she is at low risk for perioperative complications.   Her functional capacity is good at 6.36 METs according to the Duke Activity Status Index (DASI). Recommendations: She will need a CTA prior to surgery clearance Precordial chest discomfort and exertional dyspnea Intermittent precordial chest discomfort and exertional dyspnea. Symptoms may suggest GERD, but not clearly related to activity. Zero coronary artery calcium score, but further evaluation needed due to potential atypical presentation in women.   Patient underwent coronary CTA on 01/05/2024. CTA demonstrated calcium score of 0 with normal coronary arteries.   Therefore, based on ACC/AHA guidelines, the patient would be an acceptable risk for the planned procedure without further cardiovascular testing.   I will route this recommendation to the requesting party via Epic fax function and remove from pre-op pool. Please call with questions.  Barnie Hila, NP 01/06/2024, 4:24 PM

## 2024-01-06 NOTE — Telephone Encounter (Signed)
 [facility] calling to f/u on clearance. Pt had scan yesterday and they would like to know if clearance has been approved. Pts procedure is 11/10. Please advise.   Callback #: [phone] [place]

## 2024-01-11 ENCOUNTER — Ambulatory Visit: Payer: Self-pay | Admitting: Physician Assistant

## 2024-06-26 ENCOUNTER — Ambulatory Visit: Admitting: Cardiovascular Disease
